# Patient Record
Sex: Male | Born: 1985 | Race: White | Hispanic: No | Marital: Married | Smoking: Never smoker
Health system: Southern US, Community
[De-identification: ages and names within clinical notes are randomized; demographics above are authoritative.]

## PROBLEM LIST (undated history)

## (undated) DIAGNOSIS — Q2381 Bicuspid aortic valve: Secondary | ICD-10-CM

## (undated) DIAGNOSIS — I38 Endocarditis, valve unspecified: Secondary | ICD-10-CM

## (undated) DIAGNOSIS — Q231 Congenital insufficiency of aortic valve: Secondary | ICD-10-CM

## (undated) HISTORY — DX: Congenital insufficiency of aortic valve: Q23.1

## (undated) HISTORY — DX: Bicuspid aortic valve: Q23.81

## (undated) HISTORY — PX: WISDOM TOOTH EXTRACTION: SHX21

---

## 2008-04-27 ENCOUNTER — Encounter: Admission: RE | Admit: 2008-04-27 | Discharge: 2008-04-27 | Payer: Self-pay | Admitting: Occupational Medicine

## 2009-01-13 ENCOUNTER — Encounter: Admission: RE | Admit: 2009-01-13 | Discharge: 2009-01-13 | Payer: Self-pay | Admitting: Occupational Medicine

## 2009-09-03 ENCOUNTER — Emergency Department (HOSPITAL_COMMUNITY): Admission: EM | Admit: 2009-09-03 | Discharge: 2009-09-03 | Payer: Self-pay | Admitting: Emergency Medicine

## 2009-11-02 ENCOUNTER — Emergency Department (HOSPITAL_COMMUNITY): Admission: EM | Admit: 2009-11-02 | Discharge: 2009-11-02 | Payer: Self-pay | Admitting: Emergency Medicine

## 2011-01-01 ENCOUNTER — Emergency Department (HOSPITAL_COMMUNITY): Payer: Worker's Compensation

## 2011-01-01 ENCOUNTER — Emergency Department (HOSPITAL_COMMUNITY)
Admission: EM | Admit: 2011-01-01 | Discharge: 2011-01-02 | Disposition: A | Discharge status: Home / Self Care | Payer: Worker's Compensation | Attending: Emergency Medicine | Admitting: Emergency Medicine

## 2011-01-01 DIAGNOSIS — M25569 Pain in unspecified knee: Secondary | ICD-10-CM | POA: Insufficient documentation

## 2011-01-01 DIAGNOSIS — Y9269 Other specified industrial and construction area as the place of occurrence of the external cause: Secondary | ICD-10-CM | POA: Insufficient documentation

## 2011-01-01 DIAGNOSIS — M25469 Effusion, unspecified knee: Secondary | ICD-10-CM | POA: Insufficient documentation

## 2011-01-01 DIAGNOSIS — W19XXXA Unspecified fall, initial encounter: Secondary | ICD-10-CM | POA: Insufficient documentation

## 2011-01-01 DIAGNOSIS — Y990 Civilian activity done for income or pay: Secondary | ICD-10-CM | POA: Insufficient documentation
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
## 2011-11-02 ENCOUNTER — Encounter (HOSPITAL_COMMUNITY): Payer: Self-pay

## 2011-11-02 ENCOUNTER — Emergency Department (HOSPITAL_COMMUNITY)
Admission: EM | Admit: 2011-11-02 | Discharge: 2011-11-02 | Disposition: A | Discharge status: Home / Self Care | Payer: Worker's Compensation | Attending: Emergency Medicine | Admitting: Emergency Medicine

## 2011-11-02 DIAGNOSIS — S61419A Laceration without foreign body of unspecified hand, initial encounter: Secondary | ICD-10-CM

## 2011-11-02 DIAGNOSIS — S61409A Unspecified open wound of unspecified hand, initial encounter: Secondary | ICD-10-CM | POA: Insufficient documentation

## 2011-11-02 DIAGNOSIS — W268XXA Contact with other sharp object(s), not elsewhere classified, initial encounter: Secondary | ICD-10-CM | POA: Insufficient documentation

## 2011-11-02 HISTORY — DX: Endocarditis, valve unspecified: I38
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
## 2011-11-02 NOTE — ED Provider Notes (Signed)
Medical screening examination/treatment/procedure(s) were performed by non-physician practitioner and as supervising physician I was immediately available for consultation/collaboration.   Darris Staiger, MD 11/02/11 2251 

## 2012-11-01 IMAGING — CR DG KNEE COMPLETE 4+V*L*
4 series · 4 of 4 positions shown · non-contrast
Comparison: 09/03/2009

CLINICAL DATA: Left knee pain after twisting injury due to fall.

LEFT KNEE - COMPLETE 4+ VIEW

[x knee lat left]
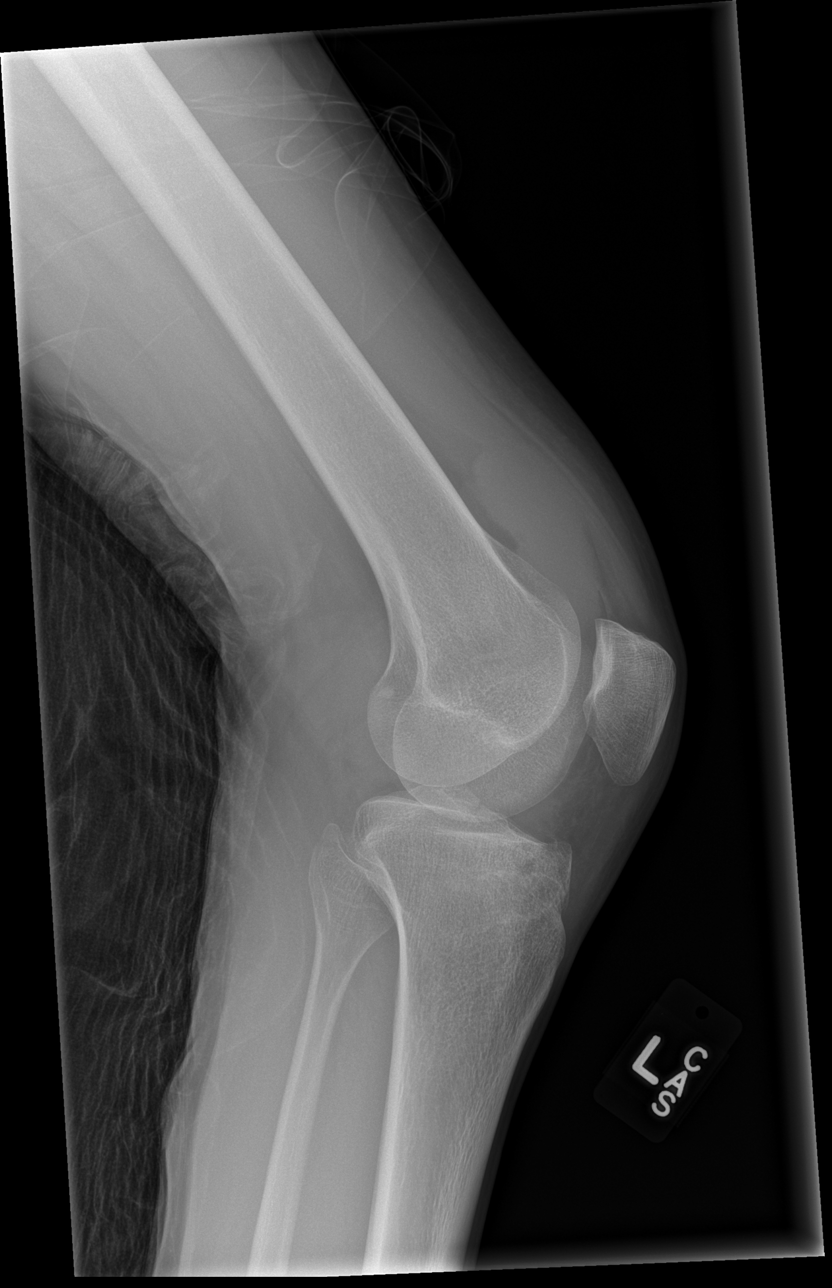

[x knee ap left (1 of 3)]
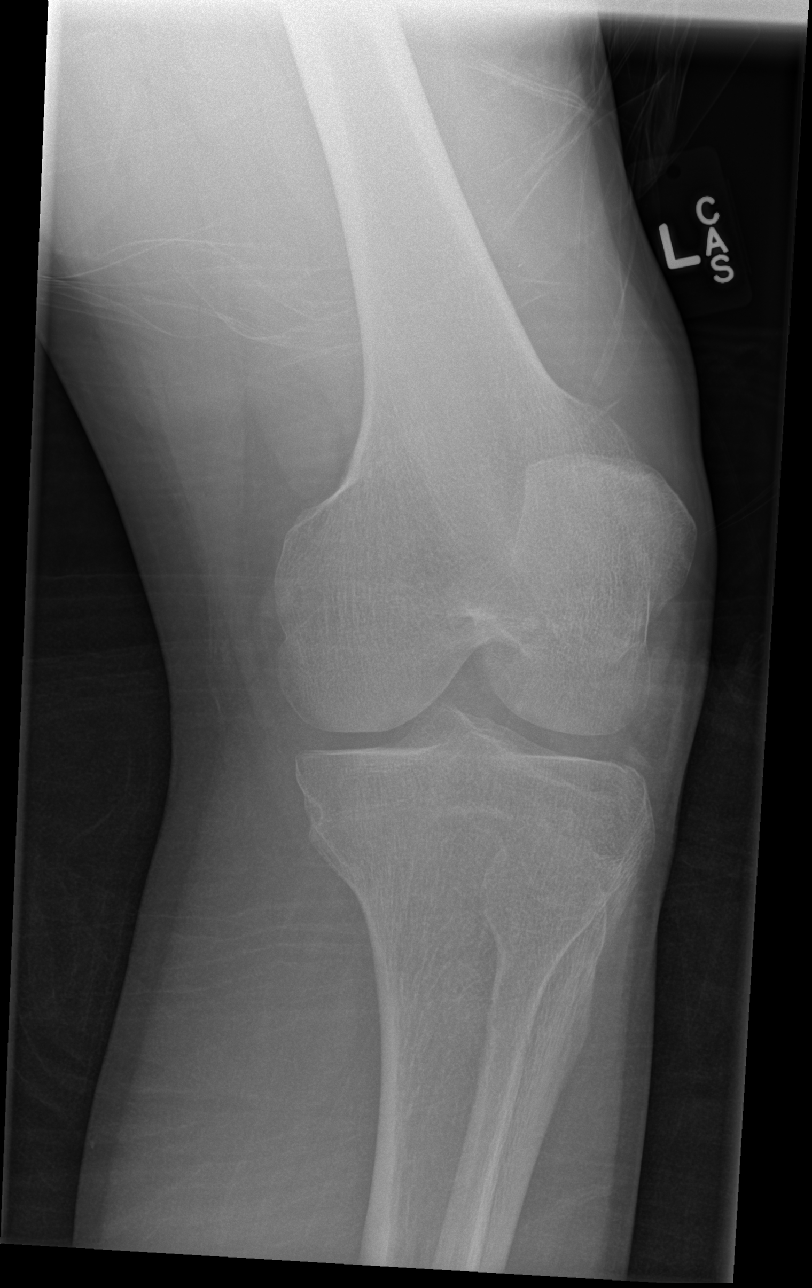

[x knee ap left (2 of 3)]
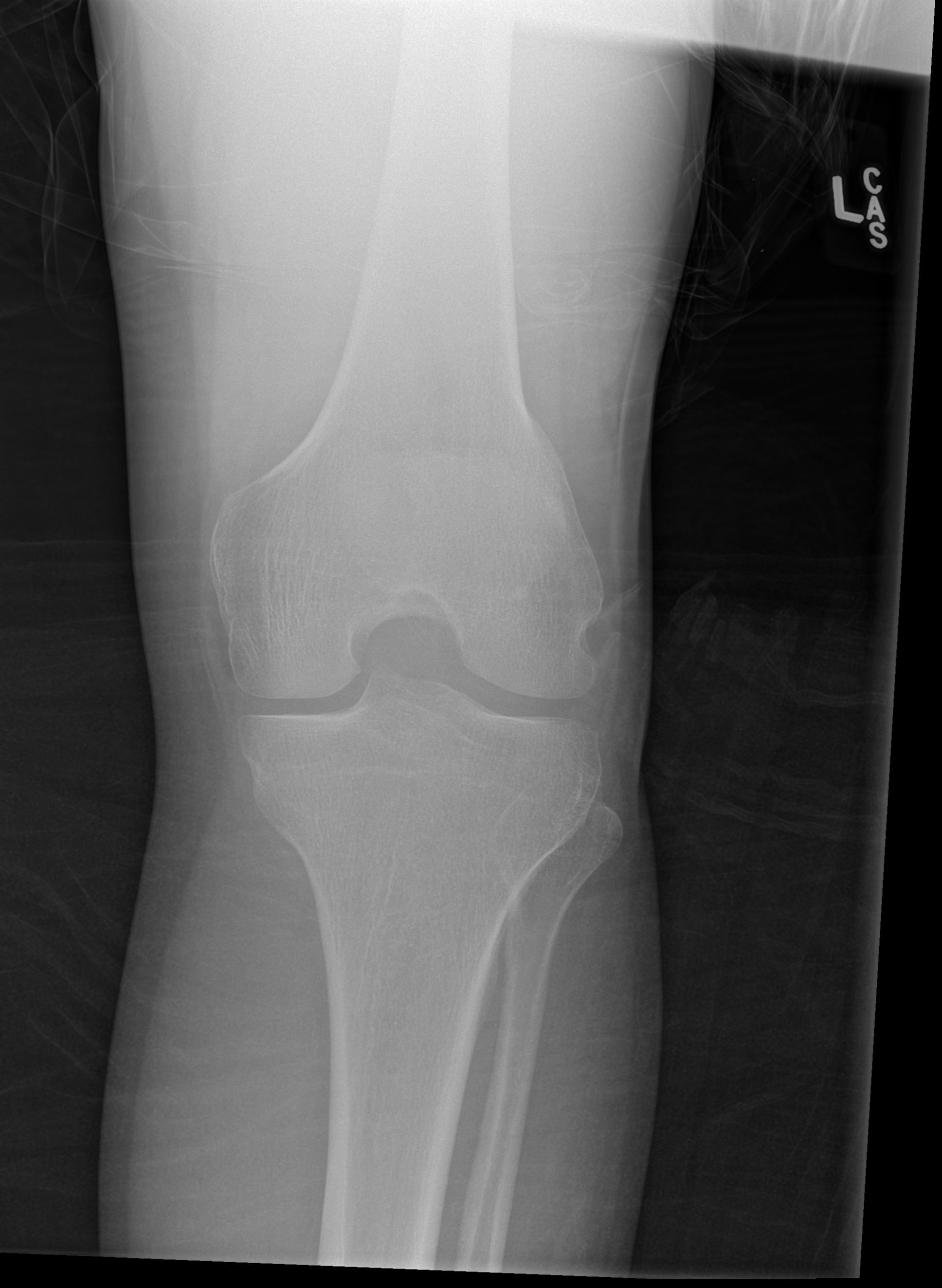

[x knee ap left (3 of 3)]
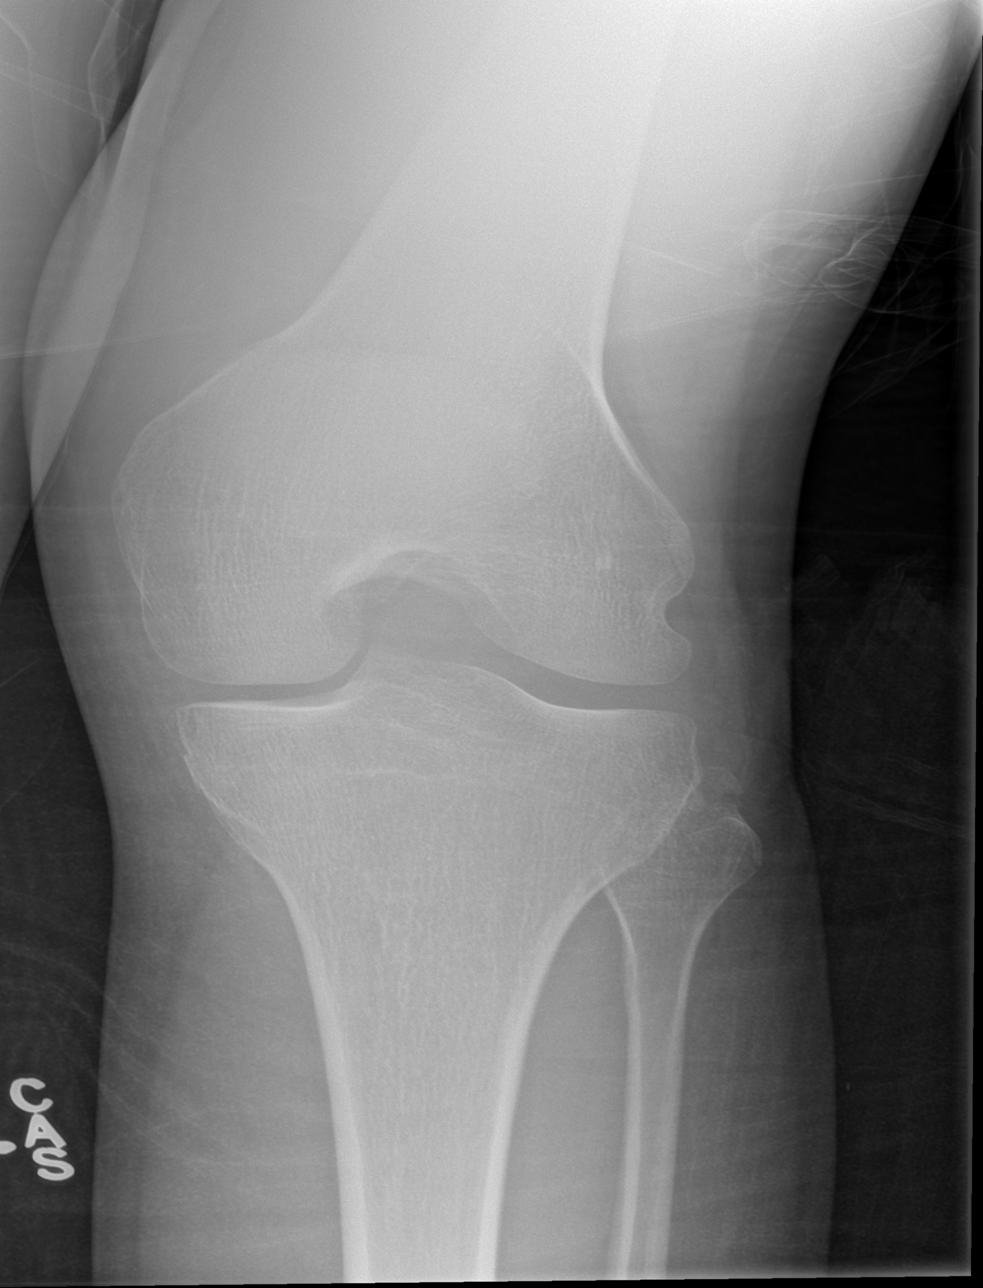

[4 of 4 positions shown; findings below may reference images not displayed]

FINDINGS: Moderate large left knee effusion.  No acute fracture or
subluxation demonstrated.  Bone cortex and trabecular architecture
appear intact.  No focal bone lesions demonstrated.
IMPRESSION: Moderately large left knee effusion.  No acute fracture or
subluxation demonstrated.

## 2013-07-12 ENCOUNTER — Encounter: Payer: Self-pay | Admitting: General Surgery

## 2013-07-12 DIAGNOSIS — Z0389 Encounter for observation for other suspected diseases and conditions ruled out: Secondary | ICD-10-CM | POA: Insufficient documentation

## 2013-07-12 DIAGNOSIS — Q231 Congenital insufficiency of aortic valve: Secondary | ICD-10-CM | POA: Insufficient documentation

## 2013-07-14 ENCOUNTER — Ambulatory Visit (INDEPENDENT_AMBULATORY_CARE_PROVIDER_SITE_OTHER): Payer: 59 | Admitting: Cardiology

## 2013-07-14 ENCOUNTER — Encounter: Payer: Self-pay | Admitting: Cardiology

## 2013-07-14 VITALS — BP 110/64 | HR 64 | Ht 66.0 in | Wt 141.0 lb

## 2013-07-14 DIAGNOSIS — Q231 Congenital insufficiency of aortic valve: Secondary | ICD-10-CM

## 2013-07-14 DIAGNOSIS — I351 Nonrheumatic aortic (valve) insufficiency: Secondary | ICD-10-CM | POA: Insufficient documentation

## 2013-07-14 DIAGNOSIS — Z0389 Encounter for observation for other suspected diseases and conditions ruled out: Secondary | ICD-10-CM

## 2013-07-14 DIAGNOSIS — I359 Nonrheumatic aortic valve disorder, unspecified: Secondary | ICD-10-CM

## 2013-07-14 NOTE — Progress Notes (Signed)
  [address] [place], [place]  [address] Phone: [phone] Fax:  [phone]  Date:  07/14/2013   ID:  Archie BalboaDustin Cuervo, DOB 07/18/1985, MRN [id number]  PCP:  No primary provider on file.  Cardiologist:  Armanda Magicraci Dublin Grayer, MD     History of Present Illness: Archie BalboaDustin Elderkin is a 28 y.o. male with a history of bicuspid AV and moderate AR presents today for followup.  He denies any chest pain, SOB, DOE, LE edema, dizziness, palpitations or syncope.   Wt Readings from Last 3 Encounters:  07/14/13 141 lb (63.957 kg)  11/02/11 145 lb (65.772 kg)     Past Medical History  Diagnosis Date  . Heart valve problem     Aortic valve dx. age 28   . Bicuspid aortic valve     moderate MR by echo    Current Outpatient Prescriptions  Medication Sig Dispense Refill  . aspirin-acetaminophen-caffeine (EXCEDRIN MIGRAINE) 250-250-65 MG per tablet Take 1 tablet by mouth every 6 (six) hours as needed for headache.      . ibuprofen (ADVIL,MOTRIN) 600 MG tablet Take 600 mg by mouth as needed (for back pain).       No current facility-administered medications for this visit.    Allergies:    Allergies  Allergen Reactions  . Morphine And Related Nausea And Vomiting    Social History:  The patient  reports that he has never smoked. He does not have any smokeless tobacco history on file. He reports that he drinks alcohol. He reports that he does not use illicit drugs.   Family History:  The patient's family history includes Hypertension in his mother.   ROS:  Please see the history of present illness.      All other systems reviewed and negative.   PHYSICAL EXAM: VS:  BP 110/64  Pulse 64  Ht 5\' 6"  (1.676 m)  Wt 141 lb (63.957 kg)  BMI 22.77 kg/m2 Well nourished, well developed, in no acute distress HEENT: normal Neck: no JVD Cardiac:  normal S1, S2; RRR; no murmur Lungs:  clear to auscultation bilaterally, no wheezing, rhonchi or rales Abd: soft, nontender, no hepatomegaly Ext: no  edema Skin: warm and dry Neuro:  CNs 2-12 intact, no focal abnormalities noted  EKG:    NSR with no ST changes and normal intervals  ASSESSMENT AND PLAN:  1. Bicuspid AV with moderate AR - recheck 2D echo  Followup with me in  1year  Signed, Armanda Magicraci Davanta Meuser, MD 07/14/2013 9:14 AM

## 2013-07-14 NOTE — Patient Instructions (Signed)
Your physician recommends that you continue on your current medications as directed. Please refer to the Current Medication list given to you today. Your physician wants you to follow-up in: 12 months with Dr. Turner.  You will receive a reminder letter in the mail two months in advance. If you don't receive a letter, please call our office to schedule the follow-up appointment.  

## 2013-08-05 ENCOUNTER — Other Ambulatory Visit (HOSPITAL_COMMUNITY): Payer: Self-pay | Admitting: Cardiology

## 2013-08-05 ENCOUNTER — Other Ambulatory Visit (HOSPITAL_COMMUNITY): Payer: Self-pay

## 2013-08-05 ENCOUNTER — Other Ambulatory Visit: Payer: Self-pay | Admitting: Cardiology

## 2013-08-05 ENCOUNTER — Ambulatory Visit (HOSPITAL_COMMUNITY): Payer: 59 | Attending: Cardiovascular Disease | Admitting: Radiology

## 2013-08-05 DIAGNOSIS — I359 Nonrheumatic aortic valve disorder, unspecified: Secondary | ICD-10-CM | POA: Insufficient documentation

## 2013-08-05 DIAGNOSIS — Q231 Congenital insufficiency of aortic valve: Secondary | ICD-10-CM | POA: Insufficient documentation

## 2013-08-05 NOTE — Progress Notes (Signed)
Echocardiogram performed.  

## 2013-10-11 ENCOUNTER — Encounter: Payer: Self-pay | Admitting: Emergency Medicine

## 2013-10-11 ENCOUNTER — Emergency Department
Admission: EM | Admit: 2013-10-11 | Discharge: 2013-10-11 | Disposition: A | Discharge status: Home / Self Care | Payer: 59 | Source: Home / Self Care | Attending: Family Medicine | Admitting: Family Medicine

## 2013-10-11 DIAGNOSIS — K649 Unspecified hemorrhoids: Secondary | ICD-10-CM

## 2013-10-11 DIAGNOSIS — R197 Diarrhea, unspecified: Secondary | ICD-10-CM

## 2013-10-11 MED ORDER — HYDROCORTISONE ACETATE 25 MG RE SUPP: 25.0000 mg | Freq: Two times a day (BID) | RECTAL | Status: AC

## 2013-10-11 NOTE — ED Provider Notes (Signed)
CSN: [id number]     Arrival date & time 10/11/13  [id number] History   First MD Initiated Contact with Patient 10/11/13 [phone]     Chief Complaint  Patient presents with  . Hemorrhoids      HPI Comments: Patient has a history of recurring diarrhea.  He has about 3 episodes per month that last about 2 to 3 days.  He had a GI evaluation in 2009 that was negative.  He developed a typical episode of diarrhea three days ago, and has now developed anal pain.  Today he had a normal stool, but anal pain has persisted and he has noted blood on his stool.  He feels well otherwise.  No abdominal pain.  Patient is a 28 y.o. male presenting with hematochezia. The history is provided by the patient and the spouse.  Rectal Bleeding Quality:  Bright red Amount:  Scant Duration:  3 days Timing:  Intermittent Progression:  Unchanged Chronicity:  New Context: diarrhea, hemorrhoids and rectal pain   Pain details:    Quality:  Burning   Severity:  Mild   Duration:  3 days   Timing:  Only with defecation   Progression:  Unchanged Similar prior episodes: no   Relieved by:  None tried Worsened by:  Defecation Ineffective treatments:  None tried Associated symptoms: no abdominal pain, no dizziness, no epistaxis, no fever, no hematemesis, no light-headedness, no recent illness and no vomiting     Past Medical History  Diagnosis Date  . Heart valve problem     Aortic valve dx. age 28   . Bicuspid aortic valve     moderate MR by echo   Past Surgical History  Procedure Laterality Date  . Wisdom tooth extraction     Family History  Problem Relation Age of Onset  . Hypertension Mother   . Gallbladder disease Mother    History  Substance Use Topics  . Smoking status: Never Smoker   . Smokeless tobacco: Not on file  . Alcohol Use: Yes    Review of Systems  Constitutional: Negative for fever.  HENT: Negative for nosebleeds.   Gastrointestinal: Positive for hematochezia. Negative for vomiting,  abdominal pain and hematemesis.  Neurological: Negative for dizziness and light-headedness.  All other systems reviewed and are negative.   Allergies  Morphine and related  Home Medications   Prior to Admission medications   Medication Sig Start Date End Date Taking? Authorizing Provider  calcium carbonate (TUMS - DOSED IN MG ELEMENTAL CALCIUM) 500 MG chewable tablet Chew 1 tablet by mouth as needed for indigestion or heartburn.   Yes Historical Provider, MD  ranitidine (ZANTAC) 150 MG capsule Take 150 mg by mouth as needed for heartburn.   Yes Historical Provider, MD  aspirin-acetaminophen-caffeine (EXCEDRIN MIGRAINE) [phone] MG per tablet Take 1 tablet by mouth every 6 (six) hours as needed for headache.    Historical Provider, MD  hydrocortisone (ANUSOL-HC) 25 MG suppository Place 1 suppository (25 mg total) rectally 2 (two) times daily. 10/11/13   Lattie HawStephen A Beese, MD  ibuprofen (ADVIL,MOTRIN) 600 MG tablet Take 600 mg by mouth as needed (for back pain).    Historical Provider, MD   BP 115/78  Pulse 64  Temp(Src) 98.1 F (36.7 C) (Oral)  Resp 16  Ht 5\' 6"  (1.676 m)  Wt 137 lb (62.143 kg)  BMI 22.12 kg/m2  SpO2 98% Physical Exam Nursing notes and Vital Signs reviewed. Appearance:  Patient appears healthy, stated age, and in no acute distress  Eyes:  Pupils are equal, round, and reactive to light and accomodation.  Extraocular movement is intact.  Conjunctivae are not inflamed   Pharynx:  Normal Neck:  Supple.   No adenopathy  Lungs:  Clear to auscultation.  Breath sounds are equal.  Heart:  Regular rate and rhythm without murmurs, rubs, or gallops.  Abdomen:  Nontender without masses or hepatosplenomegaly.  Bowel sounds are present.  No CVA or flank tenderness.  Extremities:  No edema.  No calf tenderness Skin:  No rash present. Rectal Exam:  Anus has small amount of blood present.  Small internal hemorrhoids are present, tender to palpation.  Unable to perform full rectal  exam because of anal tenderness.   ED Course  Procedures  none      MDM   1. Bleeding hemorrhoid   2. Intermittent diarrhea    Rx for Anusol HC suppositories Try fiber product such as Citrucel to control diarrhea. Followup with general surgeon if hemorrhoids persist. Also recommend follow-up with gastroenterologist    Lattie Haw, MD 10/14/13 2320

## 2013-10-11 NOTE — ED Notes (Signed)
Pt c/o hemorrhoids x 3 days, with bleeding x today. No OTC meds.

## 2013-10-11 NOTE — Discharge Instructions (Signed)
Try fiber product such as Citrucel to control diarrhea. Followup with general surgeon if hemorrhoids persist.   Hemorrhoids Hemorrhoids are swollen veins around the rectum or anus. There are two types of hemorrhoids:   Internal hemorrhoids. These occur in the veins just inside the rectum. They may poke through to the outside and become irritated and painful.  External hemorrhoids. These occur in the veins outside the anus and can be felt as a painful swelling or hard lump near the anus. CAUSES  Pregnancy.   Obesity.   Constipation or diarrhea.   Straining to have a bowel movement.   Sitting for long periods on the toilet.  Heavy lifting or other activity that caused you to strain.  Anal intercourse. SYMPTOMS   Pain.   Anal itching or irritation.   Rectal bleeding.   Fecal leakage.   Anal swelling.   One or more lumps around the anus.  DIAGNOSIS  Your caregiver may be able to diagnose hemorrhoids by visual examination. Other examinations or tests that may be performed include:   Examination of the rectal area with a gloved hand (digital rectal exam).   Examination of anal canal using a small tube (scope).   A blood test if you have lost a significant amount of blood.  A test to look inside the colon (sigmoidoscopy or colonoscopy). TREATMENT Most hemorrhoids can be treated at home. However, if symptoms do not seem to be getting better or if you have a lot of rectal bleeding, your caregiver may perform a procedure to help make the hemorrhoids get smaller or remove them completely. Possible treatments include:   Placing a rubber band at the base of the hemorrhoid to cut off the circulation (rubber band ligation).   Injecting a chemical to shrink the hemorrhoid (sclerotherapy).   Using a tool to burn the hemorrhoid (infrared light therapy).   Surgically removing the hemorrhoid (hemorrhoidectomy).   Stapling the hemorrhoid to block blood flow to  the tissue (hemorrhoid stapling).  HOME CARE INSTRUCTIONS   Eat foods with fiber, such as whole grains, beans, nuts, fruits, and vegetables. Ask your doctor about taking products with added fiber in them (fibersupplements).  Increase fluid intake. Drink enough water and fluids to keep your urine clear or pale yellow.   Exercise regularly.   Go to the bathroom when you have the urge to have a bowel movement. Do not wait.   Avoid straining to have bowel movements.   Keep the anal area dry and clean. Use wet toilet paper or moist towelettes after a bowel movement.   Medicated creams and suppositories may be used or applied as directed.   Only take over-the-counter or prescription medicines as directed by your caregiver.   Take warm sitz baths for 15-20 minutes, 3-4 times a day to ease pain and discomfort.   Place ice packs on the hemorrhoids if they are tender and swollen. Using ice packs between sitz baths may be helpful.   Put ice in a plastic bag.   Place a towel between your skin and the bag.   Leave the ice on for 15-20 minutes, 3-4 times a day.   Do not use a donut-shaped pillow or sit on the toilet for long periods. This increases blood pooling and pain.  SEEK MEDICAL CARE IF:  You have increasing pain and swelling that is not controlled by treatment or medicine.  You have uncontrolled bleeding.  You have difficulty or you are unable to have a bowel movement.  You have pain or inflammation outside the area of the hemorrhoids. MAKE SURE YOU:  Understand these instructions.  Will watch your condition.  Will get help right away if you are not doing well or get worse. Document Released: 03/15/2000 Document Revised: 03/04/2012 Document Reviewed: 01/21/2012 [facility] Patient Information 2015 [place], [place]. This information is not intended to replace advice given to you by your health care provider. Make sure you discuss any questions you have with your  health care provider.

## 2014-04-28 ENCOUNTER — Encounter: Payer: Self-pay | Admitting: Cardiology

## 2014-08-11 NOTE — Progress Notes (Signed)
Cardiology Office Note   Date:  08/12/2014   ID:  Evan Cannon, DOB 05/26/1985, MRN [id number]  PCP:  No PCP Per Patient    Chief Complaint  Patient presents with  . aortic regurgitation      History of Present Illness: Evan BalboaDustin Venson is a 29 y.o. male with a history of bicuspid AV and mild AR presents today for followup. He denies any chest pain, SOB, DOE,  palpitations or syncope.  He had some mild LE edema while on a cruise a few weeks ago that resolved when he got home.   He has occasional vertigo with quick changes in position.    Past Medical History  Diagnosis Date  . Heart valve problem     Aortic valve dx. age 29   . Bicuspid aortic valve     mild MR by echo    Past Surgical History  Procedure Laterality Date  . Wisdom tooth extraction       Current Outpatient Prescriptions  Medication Sig Dispense Refill  . aspirin-acetaminophen-caffeine (EXCEDRIN MIGRAINE) 250-250-65 MG per tablet Take 1 tablet by mouth every 6 (six) hours as needed for headache.    . calcium carbonate (TUMS - DOSED IN MG ELEMENTAL CALCIUM) 500 MG chewable tablet Chew 1 tablet by mouth as needed for indigestion or heartburn.    . hydrocortisone (ANUSOL-HC) 25 MG suppository Place 1 suppository (25 mg total) rectally 2 (two) times daily. 12 suppository 1  . ibuprofen (ADVIL,MOTRIN) 600 MG tablet Take 600 mg by mouth as needed (for back pain).    . Multiple Vitamins-Minerals (ONE-A-DAY MENS HEALTH FORMULA) TABS Take 1 tablet by mouth daily.    . ranitidine (ZANTAC) 150 MG capsule Take 150 mg by mouth as needed for heartburn.     No current facility-administered medications for this visit.    Allergies:   Morphine and related    Social History:  The patient  reports that he has never smoked. He does not have any smokeless tobacco history on file. He reports that he drinks alcohol. He reports that he does not use illicit drugs.   Family History:  The patient's family history includes  Gallbladder disease in his mother; Hypertension in his mother.    ROS:  Please see the history of present illness.   Otherwise, review of systems are positive for none.   All other systems are reviewed and negative.    PHYSICAL EXAM: VS:  BP 108/70 mmHg  Pulse 55  Ht 5\' 6"  (1.676 m)  Wt 145 lb 12.8 oz (66.134 kg)  BMI 23.54 kg/m2 , BMI Body mass index is 23.54 kg/(m^2). GEN: Well nourished, well developed, in no acute distress HEENT: normal Neck: no JVD, carotid bruits, or masses Cardiac: RRR; no murmurs, rubs, or gallops,no edema  Respiratory:  clear to auscultation bilaterally, normal work of breathing GI: soft, nontender, nondistended, + BS MS: no deformity or atrophy Skin: warm and dry, no rash Neuro:  Strength and sensation are intact Psych: euthymic mood, full affect   EKG:  EKG is ordered today. The ekg ordered today demonstrates sinus bradycardia at 55bpm with no ST changes   Recent Labs: No results found for requested labs within last 365 days.    Lipid Panel No results found for: CHOL, TRIG, HDL, CHOLHDL, VLDL, LDLCALC, LDLDIRECT    Wt Readings from Last 3 Encounters:  08/12/14 145 lb 12.8 oz (66.134 kg)  10/11/13 137 lb (62.143 kg)  07/14/13 141 lb (63.957 kg)  ASSESSMENT AND PLAN:  1. Bicuspid AV with mild AR - recheck 2D echo    Current medicines are reviewed at length with the patient today.  The patient does not have concerns regarding medicines.  The following changes have been made:  no change  Labs/ tests ordered today include: see above assessment and plan No orders of the defined types were placed in this encounter.     Disposition:   FU with me in 1 year   Signed, Quintella ReichertURNER,Aubreigh Fuerte R, MD  08/12/2014 9:06 AM    [facility] [address], [place], [place]  [address] Phone: [phone]; Fax: [phone]

## 2014-08-12 ENCOUNTER — Ambulatory Visit (INDEPENDENT_AMBULATORY_CARE_PROVIDER_SITE_OTHER): Payer: 59 | Admitting: Cardiology

## 2014-08-12 ENCOUNTER — Encounter: Payer: Self-pay | Admitting: Cardiology

## 2014-08-12 VITALS — BP 108/70 | HR 55 | Ht 66.0 in | Wt 145.8 lb

## 2014-08-12 DIAGNOSIS — I351 Nonrheumatic aortic (valve) insufficiency: Secondary | ICD-10-CM

## 2014-08-12 DIAGNOSIS — Q231 Congenital insufficiency of aortic valve: Secondary | ICD-10-CM

## 2014-08-12 NOTE — Patient Instructions (Addendum)
Medication Instructions:  Your physician recommends that you continue on your current medications as directed. Please refer to the Current Medication list given to you today.   Labwork: None  Testing/Procedures: Your physician has requested that you have an echocardiogram. Echocardiography is a painless test that uses sound waves to create images of your heart. It provides your doctor with information about the size and shape of your heart and how well your heart's chambers and valves are working. This procedure takes approximately one hour. There are no restrictions for this procedure.  Follow-Up: Your physician wants you to follow-up in: 1 year with Dr. Turner. You will receive a reminder letter in the mail two months in advance. If you don't receive a letter, please call our office to schedule the follow-up appointment.   Any Other Special Instructions Will Be Listed Below (If Applicable).   

## 2014-08-17 ENCOUNTER — Other Ambulatory Visit: Payer: Self-pay

## 2014-08-17 ENCOUNTER — Ambulatory Visit (HOSPITAL_COMMUNITY): Payer: 59 | Attending: Cardiovascular Disease

## 2014-08-17 DIAGNOSIS — I351 Nonrheumatic aortic (valve) insufficiency: Secondary | ICD-10-CM | POA: Diagnosis not present

## 2014-08-17 DIAGNOSIS — Q231 Congenital insufficiency of aortic valve: Secondary | ICD-10-CM | POA: Insufficient documentation

## 2014-09-09 ENCOUNTER — Telehealth: Payer: Self-pay

## 2014-09-09 DIAGNOSIS — I351 Nonrheumatic aortic (valve) insufficiency: Secondary | ICD-10-CM

## 2014-09-09 DIAGNOSIS — Q231 Congenital insufficiency of aortic valve: Secondary | ICD-10-CM

## 2014-09-09 NOTE — Telephone Encounter (Signed)
-----   Message from Quintella Reichert, MD sent at 08/18/2014  9:42 AM EDT ----- Please let patient know that heart function is normal with bicuspid AV and mild to moderate AR - repeat echo in 1 year

## 2014-09-09 NOTE — Telephone Encounter (Signed)
Informed patient of results and verbal understanding expressed.  Repeat ECHO ordered to be scheduled in 1 year. Patient agrees with treatment plan. 

## 2015-05-22 ENCOUNTER — Other Ambulatory Visit: Payer: Self-pay | Admitting: Nurse Practitioner

## 2015-05-22 ENCOUNTER — Ambulatory Visit
Admission: RE | Admit: 2015-05-22 | Discharge: 2015-05-22 | Disposition: A | Discharge status: Home / Self Care | Payer: Worker's Compensation | Source: Ambulatory Visit | Attending: Nurse Practitioner | Admitting: Nurse Practitioner

## 2015-05-22 DIAGNOSIS — T1490XA Injury, unspecified, initial encounter: Secondary | ICD-10-CM

## 2015-05-22 DIAGNOSIS — R609 Edema, unspecified: Secondary | ICD-10-CM
